# Patient Record
Sex: Female | Born: 2000 | Race: White | Hispanic: No | Marital: Single
Health system: Southern US, Community
[De-identification: ages and names within clinical notes are randomized; demographics above are authoritative.]

## PROBLEM LIST (undated history)

## (undated) DIAGNOSIS — T8859XA Other complications of anesthesia, initial encounter: Secondary | ICD-10-CM

## (undated) DIAGNOSIS — T4145XA Adverse effect of unspecified anesthetic, initial encounter: Secondary | ICD-10-CM

## (undated) DIAGNOSIS — H7290 Unspecified perforation of tympanic membrane, unspecified ear: Secondary | ICD-10-CM

## (undated) DIAGNOSIS — Z8614 Personal history of Methicillin resistant Staphylococcus aureus infection: Secondary | ICD-10-CM

## (undated) HISTORY — PX: TONSILECTOMY, ADENOIDECTOMY, BILATERAL MYRINGOTOMY AND TUBES: SHX2538

---
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
## 2022-01-10 DIAGNOSIS — Z419 Encounter for procedure for purposes other than remedying health state, unspecified: Secondary | ICD-10-CM | POA: Diagnosis not present

## 2022-02-09 DIAGNOSIS — Z419 Encounter for procedure for purposes other than remedying health state, unspecified: Secondary | ICD-10-CM | POA: Diagnosis not present

## 2022-02-19 ENCOUNTER — Other Ambulatory Visit: Payer: Self-pay | Admitting: Pediatrics

## 2022-02-19 DIAGNOSIS — N6325 Unspecified lump in the left breast, overlapping quadrants: Secondary | ICD-10-CM

## 2022-03-11 ENCOUNTER — Ambulatory Visit
Admission: RE | Admit: 2022-03-11 | Discharge: 2022-03-11 | Disposition: A | Discharge status: Home / Self Care | Payer: Medicaid Other | Source: Ambulatory Visit | Attending: Pediatrics | Admitting: Pediatrics

## 2022-03-11 DIAGNOSIS — N6321 Unspecified lump in the left breast, upper outer quadrant: Secondary | ICD-10-CM | POA: Diagnosis not present

## 2022-03-11 DIAGNOSIS — N6325 Unspecified lump in the left breast, overlapping quadrants: Secondary | ICD-10-CM

## 2022-03-11 DIAGNOSIS — N6323 Unspecified lump in the left breast, lower outer quadrant: Secondary | ICD-10-CM | POA: Diagnosis not present

## 2022-03-12 DIAGNOSIS — Z419 Encounter for procedure for purposes other than remedying health state, unspecified: Secondary | ICD-10-CM | POA: Diagnosis not present

## 2022-04-12 DIAGNOSIS — Z419 Encounter for procedure for purposes other than remedying health state, unspecified: Secondary | ICD-10-CM | POA: Diagnosis not present

## 2022-05-12 DIAGNOSIS — Z419 Encounter for procedure for purposes other than remedying health state, unspecified: Secondary | ICD-10-CM | POA: Diagnosis not present

## 2022-06-12 DIAGNOSIS — Z419 Encounter for procedure for purposes other than remedying health state, unspecified: Secondary | ICD-10-CM | POA: Diagnosis not present

## 2022-07-12 DIAGNOSIS — Z419 Encounter for procedure for purposes other than remedying health state, unspecified: Secondary | ICD-10-CM | POA: Diagnosis not present

## 2022-08-12 DIAGNOSIS — Z419 Encounter for procedure for purposes other than remedying health state, unspecified: Secondary | ICD-10-CM | POA: Diagnosis not present

## 2022-09-12 DIAGNOSIS — Z419 Encounter for procedure for purposes other than remedying health state, unspecified: Secondary | ICD-10-CM | POA: Diagnosis not present

## 2022-10-11 DIAGNOSIS — Z419 Encounter for procedure for purposes other than remedying health state, unspecified: Secondary | ICD-10-CM | POA: Diagnosis not present

## 2022-11-11 DIAGNOSIS — Z419 Encounter for procedure for purposes other than remedying health state, unspecified: Secondary | ICD-10-CM | POA: Diagnosis not present

## 2022-12-11 DIAGNOSIS — Z419 Encounter for procedure for purposes other than remedying health state, unspecified: Secondary | ICD-10-CM | POA: Diagnosis not present

## 2023-01-11 DIAGNOSIS — Z419 Encounter for procedure for purposes other than remedying health state, unspecified: Secondary | ICD-10-CM | POA: Diagnosis not present

## 2023-02-10 DIAGNOSIS — Z419 Encounter for procedure for purposes other than remedying health state, unspecified: Secondary | ICD-10-CM | POA: Diagnosis not present

## 2023-03-13 DIAGNOSIS — Z419 Encounter for procedure for purposes other than remedying health state, unspecified: Secondary | ICD-10-CM | POA: Diagnosis not present

## 2023-04-13 DIAGNOSIS — Z419 Encounter for procedure for purposes other than remedying health state, unspecified: Secondary | ICD-10-CM | POA: Diagnosis not present

## 2023-04-24 ENCOUNTER — Encounter: Payer: Self-pay | Admitting: *Deleted

## 2023-05-13 DIAGNOSIS — Z419 Encounter for procedure for purposes other than remedying health state, unspecified: Secondary | ICD-10-CM | POA: Diagnosis not present

## 2023-06-13 DIAGNOSIS — Z419 Encounter for procedure for purposes other than remedying health state, unspecified: Secondary | ICD-10-CM | POA: Diagnosis not present

## 2023-07-13 DIAGNOSIS — Z419 Encounter for procedure for purposes other than remedying health state, unspecified: Secondary | ICD-10-CM | POA: Diagnosis not present

## 2023-08-13 DIAGNOSIS — Z419 Encounter for procedure for purposes other than remedying health state, unspecified: Secondary | ICD-10-CM | POA: Diagnosis not present

## 2023-09-13 DIAGNOSIS — Z419 Encounter for procedure for purposes other than remedying health state, unspecified: Secondary | ICD-10-CM | POA: Diagnosis not present

## 2023-10-11 DIAGNOSIS — Z419 Encounter for procedure for purposes other than remedying health state, unspecified: Secondary | ICD-10-CM | POA: Diagnosis not present

## 2024-04-30 ENCOUNTER — Encounter: Payer: Self-pay | Admitting: *Deleted
